# Patient Record
Sex: Male | Born: 1960 | Race: White | Hispanic: No | Marital: Single | Smoking: Former smoker
Health system: Southern US, Community
[De-identification: ages and names within clinical notes are randomized; demographics above are authoritative.]

## PROBLEM LIST (undated history)

## (undated) HISTORY — PX: TONSILLECTOMY: SUR1361

---

## 2008-09-10 ENCOUNTER — Ambulatory Visit: Payer: Self-pay | Admitting: Family Medicine

## 2009-01-22 ENCOUNTER — Ambulatory Visit: Payer: Self-pay | Admitting: Family Medicine
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
## 2010-09-02 ENCOUNTER — Inpatient Hospital Stay (INDEPENDENT_AMBULATORY_CARE_PROVIDER_SITE_OTHER)
Admission: RE | Admit: 2010-09-02 | Discharge: 2010-09-02 | Disposition: A | Discharge status: Home / Self Care | Payer: BC Managed Care – PPO | Source: Ambulatory Visit | Attending: Emergency Medicine | Admitting: Emergency Medicine

## 2010-09-02 ENCOUNTER — Encounter: Payer: Self-pay | Admitting: Emergency Medicine

## 2010-09-02 DIAGNOSIS — T148 Other injury of unspecified body region: Secondary | ICD-10-CM

## 2010-09-02 DIAGNOSIS — W57XXXA Bitten or stung by nonvenomous insect and other nonvenomous arthropods, initial encounter: Secondary | ICD-10-CM

## 2010-10-03 DIAGNOSIS — R51 Headache: Secondary | ICD-10-CM

## 2010-10-05 ENCOUNTER — Ambulatory Visit
Admission: RE | Admit: 2010-10-05 | Discharge: 2010-10-05 | Disposition: A | Discharge status: Home / Self Care | Payer: BC Managed Care – PPO | Source: Ambulatory Visit | Attending: Family Medicine | Admitting: Family Medicine

## 2010-10-05 ENCOUNTER — Inpatient Hospital Stay (INDEPENDENT_AMBULATORY_CARE_PROVIDER_SITE_OTHER)
Admission: RE | Admit: 2010-10-05 | Discharge: 2010-10-05 | Disposition: A | Discharge status: Home / Self Care | Payer: BC Managed Care – PPO | Source: Ambulatory Visit | Attending: Family Medicine | Admitting: Family Medicine

## 2010-10-05 ENCOUNTER — Other Ambulatory Visit: Payer: Self-pay | Admitting: Family Medicine

## 2010-10-05 ENCOUNTER — Encounter: Payer: Self-pay | Admitting: Family Medicine

## 2010-10-05 DIAGNOSIS — R51 Headache: Secondary | ICD-10-CM
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
## 2010-12-19 NOTE — Progress Notes (Signed)
Summary: ?bug bite/TM   Vital Signs:  Patient Profile:   50 Years Old Male CC:      bug bite on LT leg x 6 days  Height:     70 inches Weight:      272.75 pounds O2 Sat:      96 % O2 treatment:    Room Air Temp:     98.6 degrees F oral Pulse rate:   97 / minute Resp:     18 per minute BP sitting:   123 / 85  (left arm) Cuff size:   large  Vitals Entered By: Clemens Catholic LPN (September 02, 2010 4:52 PM)                  Updated Prior Medication List: No Medications Current Allergies (reviewed today): No known allergies History of Present Illness History from: patient Chief Complaint: bug bite on LT leg x 6 days  History of Present Illness: He thinks he was stung or bit by something about a week ago.  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  Wasp, spider? Noticed increasing redness, irritation.  Pain has resolved but still with some tingling.  Not using any meds.  No F/C/N/V or other symptoms.  REVIEW OF SYSTEMS Constitutional Symptoms      Denies fever, chills, night sweats, weight loss, weight gain, and fatigue.  Eyes       Denies change in vision, eye pain, eye discharge, glasses, contact lenses, and eye surgery. Ear/Nose/Throat/Mouth       Denies hearing loss/aids, change in hearing, ear pain, ear discharge, dizziness, frequent runny nose, frequent nose bleeds, sinus problems, sore throat, hoarseness, and tooth pain or bleeding.  Respiratory       Denies dry cough, productive cough, wheezing, shortness of breath, asthma, bronchitis, and emphysema/COPD.  Cardiovascular       Denies murmurs, chest pain, and tires easily with exhertion.    Gastrointestinal       Denies stomach pain, nausea/vomiting, diarrhea, constipation, blood in bowel movements, and indigestion. Genitourniary       Denies painful urination, kidney stones, and loss of urinary control. Neurological       Denies paralysis, seizures, and fainting/blackouts. Musculoskeletal       Complains of redness and  swelling.      Denies muscle pain, joint pain, joint stiffness, decreased range of motion, muscle weakness, and gout.  Skin       Denies bruising, unusual mles/lumps or sores, and hair/skin or nail changes.  Psych       Denies mood changes, temper/anger issues, anxiety/stress, speech problems, depression, and sleep problems. Other Comments: pt c/o bug bite on LT leg x 6 days. no OTC meds, no fever.   Past History:  Past Medical History: Reviewed history from 09/10/2008 and no changes required. Unremarkable  Past Surgical History: Kidney Stones removal surgery Lasik Tonsillectomy knee surgery  Family History: Reviewed history from 09/10/2008 and no changes required. Kidney Stones  Social History: Reviewed history from 09/10/2008 and no changes required. Married Never Smoked Alcohol use-yes, 2 drinks weekly Drug use-no Regular exercise-yes Physical Exam General appearance: well developed, well nourished, no acute distress MSE: oriented to time, place, and person L anterior shin with 2 bite marks with slight swelling and surrounding small ecchymoses approx 2cm in diameter.  I don't see any evidence of cellulitis or bacterial infection.  Normal gait.  Distal pulses normal.   Assessment New Problems: INSECT BITE (ICD-919.4)   Plan New  Medications/Changes: DOXYCYCLINE HYCLATE 100 MG CAPS (DOXYCYCLINE HYCLATE) 1 by mouth two times a day for 7 days  #14 x 0, 09/02/2010, Hoyt Koch MD CLOBETASOL PROPIONATE 0.05 % CREA (CLOBETASOL PROPIONATE) apply two times a day to affected area  #QS x20mo x 0, 09/02/2010, Hoyt Koch MD  New Orders: Est. Patient Level III [id number] Planning Comments:   This appears to be the after-effect of an insect bite, likely more ecchymotic from the protein/venom and not an infection. I gave Rx for steroid cream to reduce the irritation of the area.  If worsening symptoms, increasing redness, swelling, then can take the Doxy. However at this  time, I don't believe ABX are required.  Follow-up with your primary care physician if not improving or if getting worse   The patient and/or caregiver has been counseled thoroughly with regard to medications prescribed including dosage, schedule, interactions, rationale for use, and possible side effects and they verbalize understanding.  Diagnoses and expected course of recovery discussed and will return if not improved as expected or if the condition worsens. Patient and/or caregiver verbalized understanding.  Prescriptions: DOXYCYCLINE HYCLATE 100 MG CAPS (DOXYCYCLINE HYCLATE) 1 by mouth two times a day for 7 days  #14 x 0   Entered and Authorized by:   Hoyt Koch MD   Signed by:   Hoyt Koch MD on 09/02/2010   Method used:   Print then Give to Patient   RxID:   [id number] CLOBETASOL PROPIONATE 0.05 % CREA (CLOBETASOL PROPIONATE) apply two times a day to affected area  #QS x39mo x 0   Entered and Authorized by:   Hoyt Koch MD   Signed by:   Hoyt Koch MD on 09/02/2010   Method used:   Print then Give to Patient   RxID:   [id number]   Orders Added: 1)  Est. Patient Level III [29528]

## 2014-03-26 ENCOUNTER — Ambulatory Visit (INDEPENDENT_AMBULATORY_CARE_PROVIDER_SITE_OTHER): Payer: Managed Care, Other (non HMO) | Admitting: Family Medicine

## 2014-03-26 ENCOUNTER — Encounter: Payer: Self-pay | Admitting: Family Medicine

## 2014-03-26 VITALS — BP 150/96 | HR 62 | Ht 71.0 in | Wt 274.0 lb

## 2014-03-26 DIAGNOSIS — L570 Actinic keratosis: Secondary | ICD-10-CM

## 2014-03-26 DIAGNOSIS — Z23 Encounter for immunization: Secondary | ICD-10-CM

## 2014-03-26 DIAGNOSIS — I10 Essential (primary) hypertension: Secondary | ICD-10-CM

## 2014-03-26 DIAGNOSIS — Z0000 Encounter for general adult medical examination without abnormal findings: Secondary | ICD-10-CM

## 2014-03-26 NOTE — Addendum Note (Signed)
Addended by: Terance Hart on: 03/26/2014 10:55 AM   Modules accepted: Orders

## 2014-03-26 NOTE — Progress Notes (Signed)
CC: Alfred Dyer is a 54 y.o. male is here for Establish Care   Subjective: HPI:  Colonoscopy: Has never had a colonoscopy, referral has been placed and emphasized importance of this procedure Prostate: Discussed screening risks/beneifts with patient today, he doesn't think he's ever had a PSA he is open to the idea of getting this today  Influenza Vaccine: Declined Pneumovax: No current indication Td/Tdap: will receive today Zoster: (Start 53 yo)  Prior heavy alcohol use however stopped drinking 2 months ago. No tobacco or recreational drug use  Review of Systems - General ROS: negative for - chills, fever, night sweats, weight gain or weight loss Ophthalmic ROS: negative for - decreased vision Psychological ROS: negative for - anxiety or depression ENT ROS: negative for - hearing change, nasal congestion, tinnitus or allergies Hematological and Lymphatic ROS: negative for - bleeding problems, bruising or swollen lymph nodes Breast ROS: negative Respiratory ROS: no cough, shortness of breath, or wheezing Cardiovascular ROS: no chest pain or dyspnea on exertion Gastrointestinal ROS: no abdominal pain, change in bowel habits, or black or bloody stools Genito-Urinary ROS: negative for - genital discharge, genital ulcers, incontinence or abnormal bleeding from genitals Musculoskeletal ROS: negative for - joint pain or muscle pain Neurological ROS: negative for - headaches or memory loss Dermatological ROS: negative for lumps, mole changes, rash and skin lesion changes  No past medical history on file.  No past surgical history on file. No family history on file.  History   Social History  . Marital Status: Single    Spouse Name: N/A  . Number of Children: N/A  . Years of Education: N/A   Occupational History  . Not on file.   Social History Main Topics  . Smoking status: Never Smoker   . Smokeless tobacco: Not on file  . Alcohol Use: Not on file  . Drug Use: Not on file   . Sexual Activity: Not on file   Other Topics Concern  . Not on file   Social History Narrative  . No narrative on file     Objective: BP 150/96 mmHg  Pulse 62  Ht 5\' 11"  (1.803 m)  Wt 274 lb (124.286 kg)  BMI 38.23 kg/m2  General: No Acute Distress HEENT: Atraumatic, normocephalic, conjunctivae normal without scleral icterus.  No nasal discharge, hearing grossly intact, TMs with good landmarks bilaterally with no middle ear abnormalities, posterior pharynx clear without oral lesions. Neck: Supple, trachea midline, no cervical nor supraclavicular adenopathy. Pulmonary: Clear to auscultation bilaterally without wheezing, rhonchi, nor rales. Cardiac: Regular rate and rhythm.  No murmurs, rubs, nor gallops. No peripheral edema.  2+ peripheral pulses bilaterally. Abdomen: Bowel sounds normal.  No masses.  Non-tender without rebound.  Negative Murphy's sign. MSK: Grossly intact, no signs of weakness.  Full strength throughout upper and lower extremities.  Full ROM in upper and lower extremities.  No midline spinal tenderness. Neuro: Gait unremarkable, CN II-XII grossly intact.  C5-C6 Reflex 2/4 Bilaterally, L4 Reflex 2/4 Bilaterally.  Cerebellar function intact. Skin: No rashes other than actinic keratosis on the left temple Psych: Alert and oriented to person/place/time.  Thought process normal. No anxiety/depression. Assessment & Plan: Alfred Dyer was seen today for establish care.  Diagnoses and all orders for this visit:  Annual physical exam Orders: -     Ambulatory referral to Gastroenterology -     Lipid panel -     CBC -     COMPLETE METABOLIC PANEL WITH GFR -  PSA  Essential hypertension  Actinic keratosis   Healthy lifestyle interventions including but not limited to regular exercise, a healthy low fat diet, moderation of salt intake, the dangers of tobacco/alcohol/recreational drug use, nutrition supplementation, and accident avoidance were discussed with the patient  and a handout was provided for future reference. Essential hypertension: His blood pressures outside of our office is reportedly consistently below 140/90. I've asked him to bring in a blood pressure diary when he follow-ups in 3 months with documented blood pressures from the nurse at his office. For now will consider this whitecoat hypertension Actinic keratosis: We do not have any liquid nitrogen today however I've offered that we can remove this with cryotherapy at any future visit if he desires.  Return in about 3 months (around 06/26/2014) for Blood Pressure Follow Up and Freeze Skin On Forehead.

## 2014-03-26 NOTE — Patient Instructions (Signed)
Dr. Orvell Careaga's General Advice Following Your Complete Physical Exam  The Benefits of Regular Exercise: Unless you suffer from an uncontrolled cardiovascular condition, studies strongly suggest that regular exercise and physical activity will add to both the quality and length of your life.  The World Health Organization recommends 150 minutes of moderate intensity aerobic activity every week.  This is best split over 3-4 days a week, and can be as simple as a brisk walk for just over 35 minutes "most days of the week".  This type of exercise has been shown to lower LDL-Cholesterol, lower average blood sugars, lower blood pressure, lower cardiovascular disease risk, improve memory, and increase one's overall sense of wellbeing.  The addition of anaerobic (or "strength training") exercises offers additional benefits including but not limited to increased metabolism, prevention of osteoporosis, and improved overall cholesterol levels.  How Can I Strive For A Low-Fat Diet?: Current guidelines recommend that 25-35 percent of your daily energy (food) intake should come from fats.  One might ask how can this be achieved without having to dissect each meal on a daily basis?  Switch to skim or 1% milk instead of whole milk.  Focus on lean meats such as ground turkey, fresh fish, baked chicken, and lean cuts of beef as your source of dietary protein.  Limit saturated fat consumption to less than 10% of your daily caloric intake.  Limit trans fatty acid consumption primarily by limiting synthetic trans fats such as partially hydrogenated oils (Ex: fried fast foods).  Substitute olive or vegetable oil for solid fats where possible.  Moderation of Salt Intake: Provided you don't carry a diagnosis of congestive heart failure nor renal failure, I recommend a daily allowance of no more than 2300 mg of salt (sodium).  Keeping under this daily goal is associated with a decreased risk of cardiovascular events, creeping  above it can lead to elevated blood pressures and increases your risk of cardiovascular events.  Milligrams (mg) of salt is listed on all nutrition labels, and your daily intake can add up faster than you think.  Most canned and frozen dinners can pack in over half your daily salt allowance in one meal.    Lifestyle Health Risks: Certain lifestyle choices carry specific health risks.  As you may already know, tobacco use has been associated with increasing one's risk of cardiovascular disease, pulmonary disease, numerous cancers, among many other issues.  What you may not know is that there are medications and nicotine replacement strategies that can more than double your chances of successfully quitting.  I would be thrilled to help manage your quitting strategy if you currently use tobacco products.  When it comes to alcohol use, I've yet to find an "ideal" daily allowance.  Provided an individual does not have a medical condition that is exacerbated by alcohol consumption, general guidelines determine "safe drinking" as no more than two standard drinks for a man or no more than one standard drink for a male per day.  However, much debate still exists on whether any amount of alcohol consumption is technically "safe".  My general advice, keep alcohol consumption to a minimum for general health promotion.  If you or others believe that alcohol, tobacco, or recreational drug use is interfering with your life, I would be happy to provide confidential counseling regarding treatment options.  General "Over The Counter" Nutrition Advice: Postmenopausal women should aim for a daily calcium intake of 1200 mg, however a significant portion of this might already be   provided by diets including milk, yogurt, cheese, and other dairy products.  Vitamin D has been shown to help preserve bone density, prevent fatigue, and has even been shown to help reduce falls in the elderly.  Ensuring a daily intake of 800 Units of  Vitamin D is a good place to start to enjoy the above benefits, we can easily check your Vitamin D level to see if you'd potentially benefit from supplementation beyond 800 Units a day.  Folic Acid intake should be of particular concern to women of childbearing age.  Daily consumption of 400-800 mcg of Folic Acid is recommended to minimize the chance of spinal cord defects in a fetus should pregnancy occur.    For many adults, accidents still remain one of the most common culprits when it comes to cause of death.  Some of the simplest but most effective preventitive habits you can adopt include regular seatbelt use, proper helmet use, securing firearms, and regularly testing your smoke and carbon monoxide detectors.  Alfred Bene B. Aleesha Ringstad DO [facility] [address], [address] [place], [place] [address] Phone: [phone]  

## 2014-03-27 ENCOUNTER — Telehealth: Payer: Self-pay | Admitting: Family Medicine

## 2014-03-27 ENCOUNTER — Encounter: Payer: Self-pay | Admitting: Family Medicine

## 2014-03-27 DIAGNOSIS — D696 Thrombocytopenia, unspecified: Secondary | ICD-10-CM

## 2014-03-27 DIAGNOSIS — D72819 Decreased white blood cell count, unspecified: Secondary | ICD-10-CM

## 2014-03-27 LAB — COMPLETE METABOLIC PANEL WITH GFR
ALK PHOS: 55 U/L (ref 39–117)
ALT: 22 U/L (ref 0–53)
AST: 27 U/L (ref 0–37)
Albumin: 4.2 g/dL (ref 3.5–5.2)
BUN: 19 mg/dL (ref 6–23)
CO2: 24 mEq/L (ref 19–32)
Calcium: 8.7 mg/dL (ref 8.4–10.5)
Chloride: 102 mEq/L (ref 96–112)
Creat: 0.9 mg/dL (ref 0.50–1.35)
GFR, Est African American: >89 mL/min
Glucose, Bld: 73 mg/dL (ref 70–99)
POTASSIUM: 4.8 meq/L (ref 3.5–5.3)
SODIUM: 142 meq/L (ref 135–145)
Total Bilirubin: 0.8 mg/dL (ref 0.2–1.2)
Total Protein: 6.8 g/dL (ref 6.0–8.3)

## 2014-03-27 LAB — LIPID PANEL
CHOLESTEROL: 154 mg/dL (ref 0–200)
HDL: 41 mg/dL (ref >=40)
LDL CALC: 92 mg/dL (ref 0–99)
TRIGLYCERIDES: 103 mg/dL (ref <150)
Total CHOL/HDL Ratio: 3.8 Ratio
VLDL: 21 mg/dL (ref 0–40)

## 2014-03-27 LAB — PSA: PSA: 1.23 ng/mL (ref <=4.00)

## 2014-03-27 LAB — CBC
HEMATOCRIT: 41.6 % (ref 39.0–52.0)
HEMOGLOBIN: 14 g/dL (ref 13.0–17.0)
MCH: 29.2 pg (ref 26.0–34.0)
MCHC: 33.7 g/dL (ref 30.0–36.0)
MCV: 86.8 fL (ref 78.0–100.0)
Platelets: 87 10*3/uL — ABNORMAL LOW (ref 150–400)
RBC: 4.79 MIL/uL (ref 4.22–5.81)
RDW: 16.1 % — ABNORMAL HIGH (ref 11.5–15.5)
WBC: 3.4 10*3/uL — ABNORMAL LOW (ref 4.0–10.5)

## 2014-03-27 NOTE — Telephone Encounter (Signed)
Seth Bake, Will you please let patient know that his blood sugar, kidney function, liver function, and cholesterol were all normal.  His WBC and platelet count were below the lower limit of normal.  I'd recommend he return for a lab only visit in one week to have these blood cell counts rechecked.  I'll put a lab slip in your inbox.

## 2014-03-27 NOTE — Telephone Encounter (Signed)
Left message on voicemail.

## 2014-03-31 ENCOUNTER — Telehealth: Payer: Self-pay | Admitting: Family Medicine

## 2014-03-31 DIAGNOSIS — D72819 Decreased white blood cell count, unspecified: Secondary | ICD-10-CM

## 2014-03-31 DIAGNOSIS — D696 Thrombocytopenia, unspecified: Secondary | ICD-10-CM

## 2014-03-31 LAB — CBC WITH DIFFERENTIAL/PLATELET
Basophils Absolute: 0 10*3/uL (ref 0.0–0.1)
Basophils Relative: 0 % (ref 0–1)
EOS PCT: 3 % (ref 0–5)
Eosinophils Absolute: 0.1 10*3/uL (ref 0.0–0.7)
HEMATOCRIT: 39.4 % (ref 39.0–52.0)
Hemoglobin: 13.4 g/dL (ref 13.0–17.0)
LYMPHS ABS: 0.9 10*3/uL (ref 0.7–4.0)
LYMPHS PCT: 33 % (ref 12–46)
MCH: 29.8 pg (ref 26.0–34.0)
MCHC: 34 g/dL (ref 30.0–36.0)
MCV: 87.8 fL (ref 78.0–100.0)
MONO ABS: 0.3 10*3/uL (ref 0.1–1.0)
MPV: 11.4 fL (ref 8.6–12.4)
Monocytes Relative: 11 % (ref 3–12)
Neutro Abs: 1.5 10*3/uL — ABNORMAL LOW (ref 1.7–7.7)
Neutrophils Relative %: 53 % (ref 43–77)
Platelets: 85 10*3/uL — ABNORMAL LOW (ref 150–400)
RBC: 4.49 MIL/uL (ref 4.22–5.81)
RDW: 16.4 % — ABNORMAL HIGH (ref 11.5–15.5)
WBC: 2.8 10*3/uL — AB (ref 4.0–10.5)

## 2014-03-31 NOTE — Telephone Encounter (Signed)
Alfred Dyer, Will you please let patient know that his WBC and platelet count remain below the lower limit of normal.  Although I don't think it's an emergency, I'm going to refer him to a hematologist so that they can discuss whether or not any further workup is needed.  Please let me know if not contacted by next week.

## 2014-03-31 NOTE — Telephone Encounter (Signed)
Pt.notified

## 2014-04-07 ENCOUNTER — Telehealth: Payer: Self-pay | Admitting: Hematology & Oncology

## 2014-04-07 NOTE — Telephone Encounter (Signed)
Left vm w NEW PATIENT today to remind them of their appointment with Dr. Ennever. Also, advised them to bring all medication bottles and insurance card information. ° °

## 2014-04-08 ENCOUNTER — Other Ambulatory Visit (HOSPITAL_BASED_OUTPATIENT_CLINIC_OR_DEPARTMENT_OTHER): Payer: Managed Care, Other (non HMO) | Admitting: Lab

## 2014-04-08 ENCOUNTER — Encounter: Payer: Self-pay | Admitting: Family

## 2014-04-08 ENCOUNTER — Ambulatory Visit: Payer: Managed Care, Other (non HMO)

## 2014-04-08 ENCOUNTER — Ambulatory Visit (HOSPITAL_BASED_OUTPATIENT_CLINIC_OR_DEPARTMENT_OTHER): Payer: Managed Care, Other (non HMO) | Admitting: Family

## 2014-04-08 ENCOUNTER — Other Ambulatory Visit: Payer: Self-pay | Admitting: Family

## 2014-04-08 DIAGNOSIS — D72819 Decreased white blood cell count, unspecified: Secondary | ICD-10-CM

## 2014-04-08 DIAGNOSIS — D696 Thrombocytopenia, unspecified: Secondary | ICD-10-CM

## 2014-04-08 LAB — CBC WITH DIFFERENTIAL (CANCER CENTER ONLY)
BASO#: 0 10*3/uL (ref 0.0–0.2)
BASO%: 0.5 % (ref 0.0–2.0)
EOS%: 3.8 % (ref 0.0–7.0)
Eosinophils Absolute: 0.2 10*3/uL (ref 0.0–0.5)
HCT: 40.9 % (ref 38.7–49.9)
HEMOGLOBIN: 14.4 g/dL (ref 13.0–17.1)
LYMPH#: 1 10*3/uL (ref 0.9–3.3)
LYMPH%: 26.6 % (ref 14.0–48.0)
MCH: 30.4 pg (ref 28.0–33.4)
MCHC: 35.2 g/dL (ref 32.0–35.9)
MCV: 86 fL (ref 82–98)
MONO#: 0.4 10*3/uL (ref 0.1–0.9)
MONO%: 11 % (ref 0.0–13.0)
NEUT#: 2.3 10*3/uL (ref 1.5–6.5)
NEUT%: 58.1 % (ref 40.0–80.0)
RBC: 4.74 10*6/uL (ref 4.20–5.70)
RDW: 14.5 % (ref 11.1–15.7)
WBC: 3.9 10*3/uL — ABNORMAL LOW (ref 4.0–10.0)

## 2014-04-08 LAB — CHCC SATELLITE - SMEAR

## 2014-04-08 NOTE — Progress Notes (Signed)
Hematology/Oncology Consultation   Name: Alfred Dyer      MRN: [id number]    Location: Room/bed info not found  Date: 04/08/2014 Time:2:15 PM   REFERRING PHYSICIAN: Sean Hommel  REASON FOR CONSULT:  Thrombocytopenia Leukopenia   DIAGNOSIS:  Thrombocytopenia Leukopenia  HISTORY OF PRESENT ILLNESS:  Alfred Dyer is a very pleasant 54 yo white male with a recent history of thrombocytopenia and leukopenia. His WBC count today is 3.9 and platelets are 87. He has had no problems with infections or episodes of bleeding.  He has been feeling tired and a little "light headed" at times.  He has a history of alcohol abuse. He was drinking 3-4 1/2 gallons of whisky/week. He has not had a drink in 2 months and 6 days. He is very happy about this.  He states that his LFTs usually run "slightly elevated." He states that he is "Rh - and had a complete blood transfusions as a small child."  He denies any personal or familial history of cancer.   No history of blood clots or bleeding disorders.  He has 3 children all of whom are healthy.  He is on a great deal of vitamin and weight loss supplements.  He spent 17 years in the [employer] and [employer].  He denies fever, chills, n/v, cough, rash, dizziness, blurred vision, dizziness, SOB, chest pain, palpitations, abdominal pain, constipation, diarrhea, blood in urine or stool.  No swelling, tenderness, numbness or tingling in his extremities.  His appetite is good and he is staying hydrated. His weight is stable.   ROS: All other 10 point review of systems is negative.   PAST MEDICAL HISTORY:   History reviewed. No pertinent past medical history.  ALLERGIES: No Known Allergies    MEDICATIONS:  No current outpatient prescriptions on file prior to visit.   No current facility-administered medications on file prior to visit.     PAST SURGICAL HISTORY Past Surgical History  Procedure Laterality Date  . Tonsillectomy      FAMILY  HISTORY: History reviewed. No pertinent family history.  SOCIAL HISTORY:  reports that he quit smoking about 41 years ago. He does not have any smokeless tobacco history on file. He reports that he does not use illicit drugs. His alcohol history is not on file.  PERFORMANCE STATUS: The patient's performance status is 0 - Asymptomatic  PHYSICAL EXAM: Most Recent Vital Signs: Blood pressure 137/97, pulse 88, temperature 98 F (36.7 C), temperature source Oral, resp. rate 16, height 5\' 11"  (1.803 m), weight 274 lb (124.286 kg). BP 137/97 mmHg  Pulse 88  Temp(Src) 98 F (36.7 C) (Oral)  Resp 16  Ht 5\' 11"  (1.803 m)  Wt 274 lb (124.286 kg)  BMI 38.23 kg/m2  General Appearance:    Alert, cooperative, no distress, appears stated age  Head:    Normocephalic, without obvious abnormality, atraumatic  Eyes:    PERRL, conjunctiva/corneas clear, EOM's intact, fundi    benign, both eyes             Throat:   Lips, mucosa, and tongue normal; teeth and gums normal  Neck:   Supple, symmetrical, trachea midline, no adenopathy;       thyroid:  No enlargement/tenderness/nodules; no carotid   bruit or JVD  Back:     Symmetric, no curvature, ROM normal, no CVA tenderness  Lungs:     Clear to auscultation bilaterally, respirations unlabored  Chest wall:    No tenderness or deformity  Heart:    Regular rate and rhythm, S1 and S2 normal, no murmur, rub   or gallop  Abdomen:     Soft, non-tender, bowel sounds active all four quadrants,    no masses, no organomegaly        Extremities:   Extremities normal, atraumatic, no cyanosis or edema  Pulses:   2+ and symmetric all extremities  Skin:   Skin color, texture, turgor normal, no rashes or lesions  Lymph nodes:   Cervical, supraclavicular, and axillary nodes normal  Neurologic:   CNII-XII intact. Normal strength, sensation and reflexes      throughout   LABORATORY DATA:  No results found for this or any previous visit (from the past 48 hour(s)).     RADIOGRAPHY: No results found.     PATHOLOGY: None  ASSESSMENT/PLAN: Alfred Dyer is a very pleasant 54 yo white male with a recent history of thrombocytopenia and leukopenia.  His WBC count today is 3.9 and platelets are 87. He has had no problems with infections or episodes of bleeding. We will see what the rest of his blood work shows.   He has a significant history of ETOH abuse. We will get an ultrasound of the abdomen to assess his liver.  Dr. Marin Olp did view his blood smear and noticed no abnormality or evidence of malignancy. We will see him back in 4 months for labs and follow-up.  All questions were answered. The patient knows to call the clinic with any problems, questions or concerns. We can certainly see the patient much sooner if necessary.  The patient was discussed with and also seen by Dr. Marin Olp and he is in agreement with the aforementioned.   [facility]    Addendum:   I saw and examined the patient with Sarah. He is a real nice man. I fun talking to him.  His blood smear did not show anything that looked suspicious for a hematologic issue. They good maturation of his white blood cells. I do not see any hypersegmented polys. He had no atypical lymphocytes. There were no immature myeloid cells. His platelets were slightly decreased in number. He has several large platelets. Platelets were well granulated.  I think that the issue with him has been his alcohol use. He has a history of heavy alcohol use. He's not had anything to drink for 2 months. 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 If this is the case, then it is possible that his platelets and white cells will improve with continued alcohol abstinence.  I will get a ultrasound of his abdomen just to make sure nothing else is going on. He is a big man so it is hard to palpate his spleen. I suppose that he may have some cirrhosis with splenomegaly. If this is the case, then this  would certainly explain his hematologic changes.  I think we can probably see him back in about 3 months or so. We will try to see how things look with the ultrasound area and  I don't see that he needs a bone marrow test.  We spent about 45 minutes with him. We answered all his questions.  Lum Keas

## 2014-04-09 ENCOUNTER — Telehealth: Payer: Self-pay | Admitting: Hematology & Oncology

## 2014-04-09 LAB — RHEUMATOID FACTOR: RHEUMATOID FACTOR: 61 [IU]/mL — AB (ref <=14)

## 2014-04-09 LAB — ANA: ANA: NEGATIVE

## 2014-04-09 NOTE — Telephone Encounter (Signed)
Pt aware of 4-20 [place] to be NPO 6 hrs and 7-20 appointment

## 2014-04-24 LAB — HM COLONOSCOPY

## 2014-04-28 ENCOUNTER — Encounter: Payer: Self-pay | Admitting: Family Medicine

## 2014-04-30 ENCOUNTER — Encounter: Payer: Self-pay | Admitting: Family Medicine

## 2014-04-30 DIAGNOSIS — Z86010 Personal history of colonic polyps: Secondary | ICD-10-CM | POA: Insufficient documentation

## 2014-05-06 ENCOUNTER — Ambulatory Visit (HOSPITAL_BASED_OUTPATIENT_CLINIC_OR_DEPARTMENT_OTHER)
Admission: RE | Admit: 2014-05-06 | Discharge: 2014-05-06 | Disposition: A | Discharge status: Home / Self Care | Payer: Managed Care, Other (non HMO) | Source: Ambulatory Visit | Attending: Family | Admitting: Family

## 2014-05-06 DIAGNOSIS — R161 Splenomegaly, not elsewhere classified: Secondary | ICD-10-CM | POA: Diagnosis not present

## 2014-05-06 DIAGNOSIS — D696 Thrombocytopenia, unspecified: Secondary | ICD-10-CM | POA: Insufficient documentation

## 2014-05-13 ENCOUNTER — Ambulatory Visit (INDEPENDENT_AMBULATORY_CARE_PROVIDER_SITE_OTHER): Payer: Managed Care, Other (non HMO)

## 2014-05-13 ENCOUNTER — Encounter: Payer: Self-pay | Admitting: Family Medicine

## 2014-05-13 ENCOUNTER — Ambulatory Visit (INDEPENDENT_AMBULATORY_CARE_PROVIDER_SITE_OTHER): Payer: Managed Care, Other (non HMO) | Admitting: Family Medicine

## 2014-05-13 VITALS — BP 130/82 | HR 69 | Wt 274.0 lb

## 2014-05-13 DIAGNOSIS — K7460 Unspecified cirrhosis of liver: Secondary | ICD-10-CM | POA: Insufficient documentation

## 2014-05-13 DIAGNOSIS — M542 Cervicalgia: Secondary | ICD-10-CM | POA: Diagnosis not present

## 2014-05-13 DIAGNOSIS — K7030 Alcoholic cirrhosis of liver without ascites: Secondary | ICD-10-CM

## 2014-05-13 NOTE — Progress Notes (Signed)
CC: Alfred Dyer is a 54 y.o. male is here for discuss u/s results and Neck Pain   Subjective: HPI:  Recently had an ultrasound of liver done reflecting cirrhosis. He tells me he's never had any right upper quadrant pain nor jaundice. He admits that he was a heavy drinker for the majority of his life but has been sober for 4 months now. He does not that he was ever exposed to hepatitis B or C but he cannot be entirely sure. Denies any recent confusion or abdominal pain in general.  Complains today also of having posterior neck pain that's been present for about a year and a half now. It's nonradiating described as a stiffness and it fluctuates between mild and moderate in severity. No benefit from over-the-counter anti-inflammatories. Improves greatly with chiropractic maneuvers however only lasts for about 1 day. Denies any recent motor or sensory disturbances nor radiation of pain into the shoulders or arms. No particular position of the head or neck worsens or alleviates the pain. Denies difficulty swallowing chest pain or shortness of breath   Review Of Systems Outlined In HPI  No past medical history on file.  Past Surgical History  Procedure Laterality Date  . Tonsillectomy     No family history on file.  History   Social History  . Marital Status: Single    Spouse Name: N/A  . Number of Children: N/A  . Years of Education: N/A   Occupational History  . Not on file.   Social History Main Topics  . Smoking status: Former Smoker    Quit date: 03/25/1973  . Smokeless tobacco: Not on file  . Alcohol Use: Not on file  . Drug Use: No  . Sexual Activity:    Partners: Female   Other Topics Concern  . Not on file   Social History Narrative     Objective: BP 130/82 mmHg  Pulse 69  Wt 274 lb (124.286 kg)  General: Alert and Oriented, No Acute Distress HEENT: Pupils equal, round, reactive to light. Conjunctivae clear.  Moist his membranes Lungs: Clear and comfortable  work of breathing Cardiac: Regular rate and rhythm. Abdomen: Obese and soft Neck: No midline spinous process tenderness in the cervical spine, full range of motion and strengthen the cervical spine in all 3 planes. No paraspinal musculature tenderness or hypertonicity. Extremities: No peripheral edema.  Strong peripheral pulses.  Mental Status: No depression, anxiety, nor agitation. Skin: Warm and dry.  Assessment & Plan: Alfred Dyer was seen today for discuss u/s results and neck pain.  Diagnoses and all orders for this visit:  Alcoholic cirrhosis of liver without ascites Orders: -     Hepatitis C antibody -     Hepatitis B Surface AntiBODY -     Alpha-1-antitrypsin -     Ambulatory referral to Gastroenterology  Neck pain Orders: -     DG Cervical Spine Complete; Future   Alcoholic cirrhosis: Currently is asymptomatic, congratulated his continued cessation of alcohol and emphasize the importance of complete cessation for the rest of his life. Liver function from his first visit with me earlier this year was normal therefore will not pursue that test again. He is agreeable to ruling out hepatitis C or B infection or even alpha antitrypsin deficiency that could have contributed to his cirrhosis. Referral to gastroenterology for consideration of biopsy. Neck pain: Obtaining plain films of the spine, ultimate plan will be based on the results of this.    Return if symptoms worsen  or fail to improve.

## 2014-05-14 ENCOUNTER — Telehealth: Payer: Self-pay | Admitting: Family Medicine

## 2014-05-14 DIAGNOSIS — R9389 Abnormal findings on diagnostic imaging of other specified body structures: Secondary | ICD-10-CM

## 2014-05-14 MED ORDER — MELOXICAM 15 MG PO TABS: 15.0000 mg | ORAL_TABLET | Freq: Every day | ORAL | Status: DC | PRN

## 2014-05-14 NOTE — Telephone Encounter (Signed)
Alfred Dyer, Will you please let patient know that his xrays did not show any significant spine disease which is reassuring.  This makes me conclude that his posterior neck pain is coming from the muscles.  I'd recommend he start a as needed anti-inflammatory called meloxicam that i sent to his [facility] [place].  This replaces any naproxen he would be taking.    Also the radiologist has recommend a chest xray since one of the images they took looked distorted and didn't get a clear picture of the lower neck.  Orders for this have been placed, all he needs to do is show up to the imaging office at his convenience.

## 2014-05-14 NOTE — Telephone Encounter (Signed)
Pt.notified

## 2014-05-15 ENCOUNTER — Ambulatory Visit (INDEPENDENT_AMBULATORY_CARE_PROVIDER_SITE_OTHER): Payer: Managed Care, Other (non HMO)

## 2014-05-15 DIAGNOSIS — R9389 Abnormal findings on diagnostic imaging of other specified body structures: Secondary | ICD-10-CM

## 2014-05-15 DIAGNOSIS — R938 Abnormal findings on diagnostic imaging of other specified body structures: Secondary | ICD-10-CM | POA: Diagnosis not present

## 2014-06-02 ENCOUNTER — Encounter: Payer: Self-pay | Admitting: Family Medicine

## 2014-06-02 DIAGNOSIS — I8500 Esophageal varices without bleeding: Secondary | ICD-10-CM | POA: Insufficient documentation

## 2014-06-22 ENCOUNTER — Other Ambulatory Visit: Payer: Self-pay | Admitting: Adult Health

## 2014-06-22 ENCOUNTER — Ambulatory Visit (INDEPENDENT_AMBULATORY_CARE_PROVIDER_SITE_OTHER): Payer: Self-pay

## 2014-06-22 DIAGNOSIS — M546 Pain in thoracic spine: Secondary | ICD-10-CM

## 2014-06-22 DIAGNOSIS — R52 Pain, unspecified: Secondary | ICD-10-CM

## 2014-06-22 DIAGNOSIS — M542 Cervicalgia: Secondary | ICD-10-CM

## 2014-06-29 ENCOUNTER — Ambulatory Visit: Payer: Managed Care, Other (non HMO) | Admitting: Family Medicine

## 2014-08-05 ENCOUNTER — Other Ambulatory Visit: Payer: Managed Care, Other (non HMO)

## 2014-08-05 ENCOUNTER — Ambulatory Visit: Payer: Managed Care, Other (non HMO) | Admitting: Hematology & Oncology

## 2015-03-02 ENCOUNTER — Other Ambulatory Visit: Payer: Self-pay | Admitting: Gastroenterology

## 2015-03-02 DIAGNOSIS — K7460 Unspecified cirrhosis of liver: Secondary | ICD-10-CM

## 2015-03-03 ENCOUNTER — Ambulatory Visit (INDEPENDENT_AMBULATORY_CARE_PROVIDER_SITE_OTHER): Payer: Managed Care, Other (non HMO)

## 2015-03-03 DIAGNOSIS — K7460 Unspecified cirrhosis of liver: Secondary | ICD-10-CM

## 2015-06-23 ENCOUNTER — Encounter: Payer: Self-pay | Admitting: Family Medicine

## 2015-06-23 ENCOUNTER — Ambulatory Visit (INDEPENDENT_AMBULATORY_CARE_PROVIDER_SITE_OTHER): Payer: Managed Care, Other (non HMO) | Admitting: Family Medicine

## 2015-06-23 VITALS — BP 144/98 | HR 101 | Wt 279.0 lb

## 2015-06-23 DIAGNOSIS — J329 Chronic sinusitis, unspecified: Secondary | ICD-10-CM

## 2015-06-23 DIAGNOSIS — K219 Gastro-esophageal reflux disease without esophagitis: Secondary | ICD-10-CM | POA: Diagnosis not present

## 2015-06-23 DIAGNOSIS — B9689 Other specified bacterial agents as the cause of diseases classified elsewhere: Secondary | ICD-10-CM

## 2015-06-23 DIAGNOSIS — D1720 Benign lipomatous neoplasm of skin and subcutaneous tissue of unspecified limb: Secondary | ICD-10-CM

## 2015-06-23 DIAGNOSIS — R252 Cramp and spasm: Secondary | ICD-10-CM

## 2015-06-23 DIAGNOSIS — A499 Bacterial infection, unspecified: Secondary | ICD-10-CM

## 2015-06-23 MED ORDER — OMEPRAZOLE 40 MG PO CPDR: DELAYED_RELEASE_CAPSULE | ORAL | Status: DC

## 2015-06-23 MED ORDER — AZITHROMYCIN 250 MG PO TABS: ORAL_TABLET | ORAL | Status: AC

## 2015-06-23 NOTE — Progress Notes (Signed)
CC: Alfred Dyer is a 55 y.o. male is here for Weight Loss and Generalized Body Aches   Subjective: HPI:  Follow GERD: He is taking over-the-counter omeprazole on a daily basis. He is also taking vimovo and once okay to take both of these medications at the same time. He denies any epigastric discomfort provided he takes omeprazole daily. He denies any gastrointestinal complaints today.  He has a mass on his right forearm that's been present for matter of years and slowly enlarging. It's tender to the touch and he wants know if I can remove it for him. He denies skin changes elsewhere.  His major complaint today is cramping in the lower extremities that occurs at night almost every night of the week. Over the past 2 or 3 weeks it's been worsening. It can involve his face, his thighs, calves, or feet. No interventions as of yet other than taking magnesium but it doesn't seem to be helping. He recently had blood work done that showed normal lateral lites other than a mildly depressed calcium. He denies any fasciculations or weakness. He denies any other motor or sensory disturbances.  He was recently diagnosed with a bacterial sinus infection. He's been taking Augmentin haphazardly for the last week or 2. He tells me that he has difficulty complying with something for more than 5 days straight. Continues to have nasal congestion and facial pressure with nonproductive cough   Review Of Systems Outlined In HPI  No past medical history on file.  Past Surgical History  Procedure Laterality Date  . Tonsillectomy     No family history on file.  Social History   Social History  . Marital Status: Single    Spouse Name: N/A  . Number of Children: N/A  . Years of Education: N/A   Occupational History  . Not on file.   Social History Main Topics  . Smoking status: Former Smoker    Quit date: 03/25/1973  . Smokeless tobacco: Not on file  . Alcohol Use: Not on file  . Drug Use: No  . Sexual  Activity:    Partners: Female   Other Topics Concern  . Not on file   Social History Narrative     Objective: BP 144/98 mmHg  Pulse 101  Wt 279 lb (126.554 kg)  General: Alert and Oriented, No Acute Distress HEENT: Pupils equal, round, reactive to light. Conjunctivae clear.  External ears unremarkable, canals clear with intact TMs with appropriate landmarks.  Middle ear appears open without effusion. Pink inferior turbinates.  Moist mucous membranes, pharynx without inflammation nor lesions.  Neck supple without palpable lymphadenopathy nor abnormal masses. Lungs: Clear to auscultation bilaterally, no wheezing/ronchi/rales.  Comfortable work of breathing. Good air movement. Cardiac: Regular rate and rhythm. Normal S1/S2.  No murmurs, rubs, nor gallops.   Abdomen: Moderate obesity Extremities: No peripheral edema.  Strong peripheral pulses.  Mental Status: No depression, anxiety, nor agitation. Skin: Warm and dry. Tender lipoma on the right arm  Assessment & Plan: Alfred Dyer was seen today for weight loss and generalized body aches.  Diagnoses and all orders for this visit:  Gastroesophageal reflux disease without esophagitis -     omeprazole (PRILOSEC) 40 MG capsule; One by mouth daily at least one hour before a meal.  Lipoma of arm -     Ambulatory referral to General Surgery  Cramps, extremity -     Magnesium -     Comprehensive Metabolic Panel (CMET)  Bacterial sinusitis  Other orders -  azithromycin (ZITHROMAX) 250 MG tablet; Take two tabs at once on day 1, then one tab daily on days 2-5.   GERD: Controlled it's okay to take vimovo along with omeprazole, it'll be cheaper if he takes a prescription as opposed to over-the-counter omeprazole Lower extremity cramping, rechecking calcium, electrolytes and check a magnesium for the first time. From what I can tell he drinks very little water and is a heavy sweater Bacterial sinusitis: He believes that he'll be more  compliant with azithromycin.   Return if symptoms worsen or fail to improve.

## 2015-06-24 LAB — COMPREHENSIVE METABOLIC PANEL
ALK PHOS: 52 U/L (ref 40–115)
ALT: 45 U/L (ref 9–46)
AST: 24 U/L (ref 10–35)
Albumin: 4.2 g/dL (ref 3.6–5.1)
BUN: 22 mg/dL (ref 7–25)
CO2: 25 mmol/L (ref 20–31)
Calcium: 9.6 mg/dL (ref 8.6–10.3)
Chloride: 104 mmol/L (ref 98–110)
Creat: 1.02 mg/dL (ref 0.70–1.33)
Glucose, Bld: 110 mg/dL — ABNORMAL HIGH (ref 65–99)
POTASSIUM: 4.1 mmol/L (ref 3.5–5.3)
Sodium: 140 mmol/L (ref 135–146)
Total Bilirubin: 1 mg/dL (ref 0.2–1.2)
Total Protein: 6.5 g/dL (ref 6.1–8.1)

## 2015-06-24 LAB — MAGNESIUM: Magnesium: 2 mg/dL (ref 1.5–2.5)

## 2015-06-29 ENCOUNTER — Other Ambulatory Visit: Payer: Self-pay | Admitting: Family Medicine

## 2015-06-29 DIAGNOSIS — R079 Chest pain, unspecified: Secondary | ICD-10-CM

## 2016-02-28 ENCOUNTER — Other Ambulatory Visit: Payer: Self-pay

## 2016-02-28 DIAGNOSIS — K219 Gastro-esophageal reflux disease without esophagitis: Secondary | ICD-10-CM

## 2016-02-28 MED ORDER — OMEPRAZOLE 40 MG PO CPDR: 40.0000 mg | DELAYED_RELEASE_CAPSULE | Freq: Every day | ORAL | 0 refills | Status: AC

## 2016-08-31 IMAGING — US US ABDOMEN COMPLETE
1 series · 13 of 25 positions shown · non-contrast
Comparison: Ultrasound of the abdomen of 05/06/2014

CLINICAL DATA: History of ETOH abuse and cirrhosis, history of
kidney stones

EXAM:
ABDOMEN ULTRASOUND COMPLETE

[Series 1: us abdomen complete · 0.29mm/px · 13 of 63 slices shown]
[im 1/63]
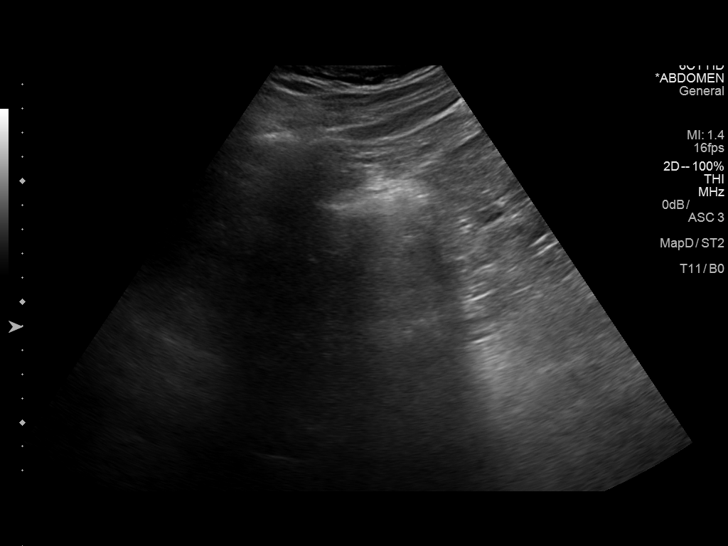
[im 6/63]
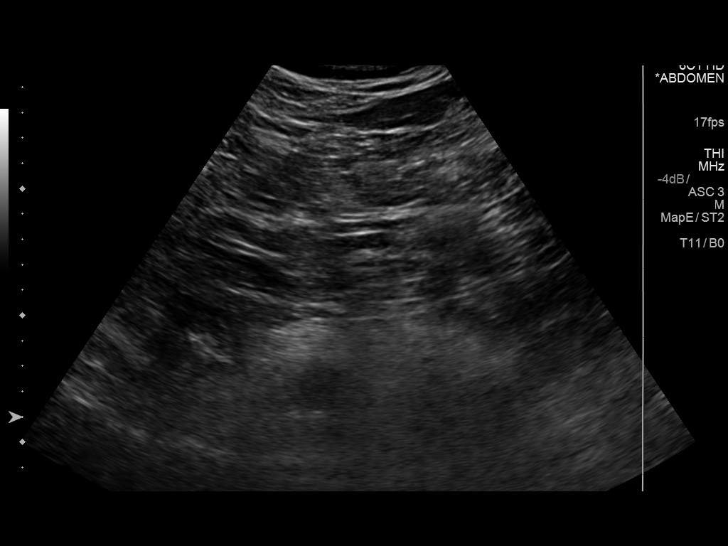
[im 11/63]
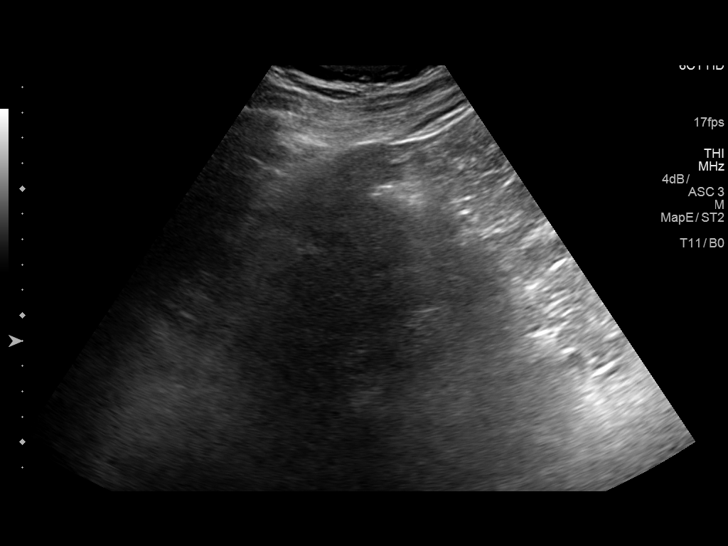
[im 16/63]
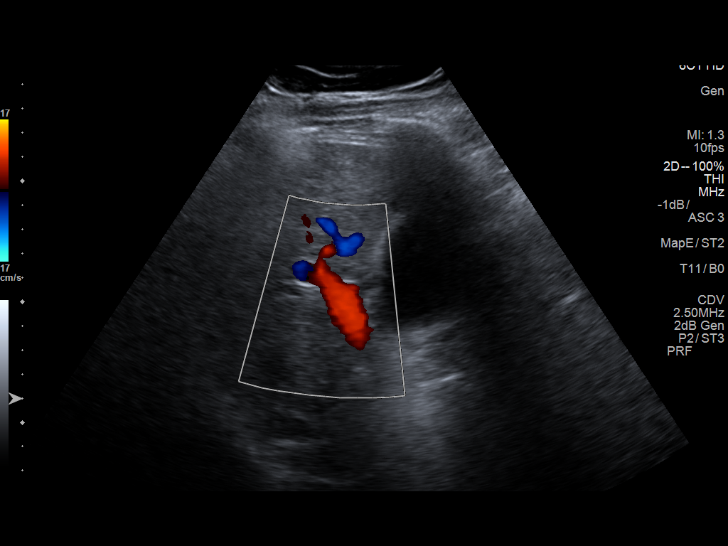
[im 21/63]
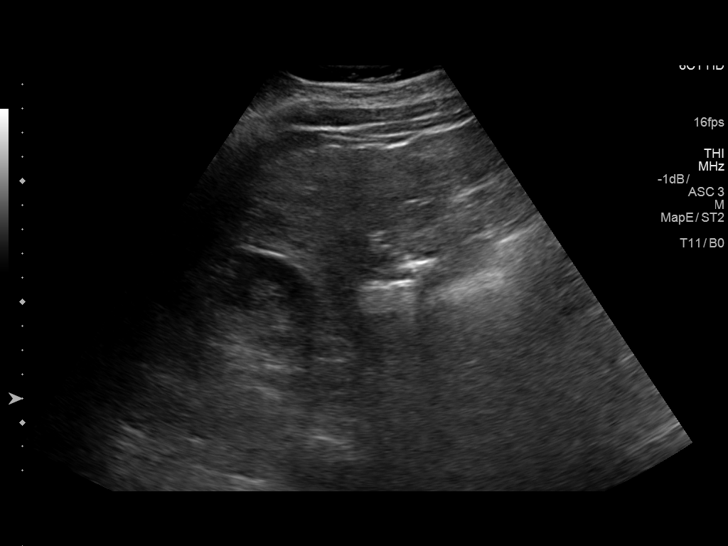
[im 26/63]
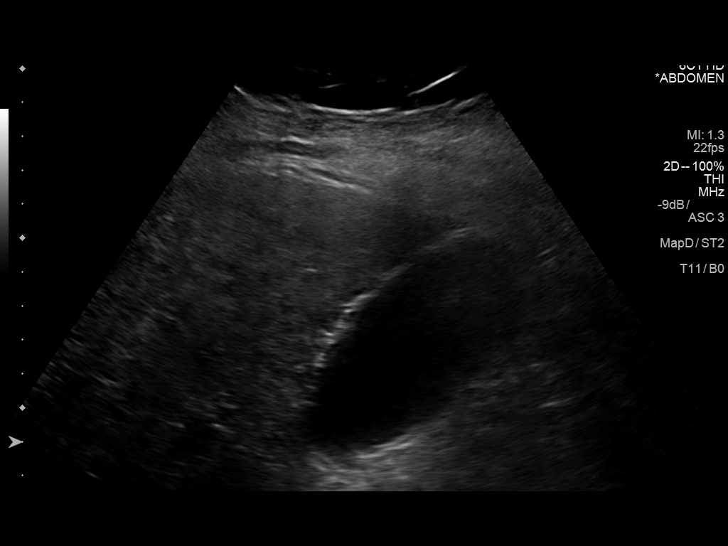
[im 32/63]
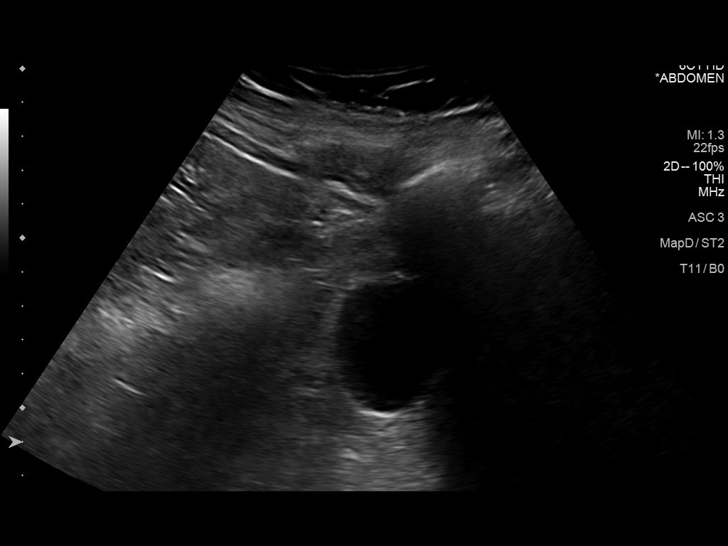
[im 37/63]
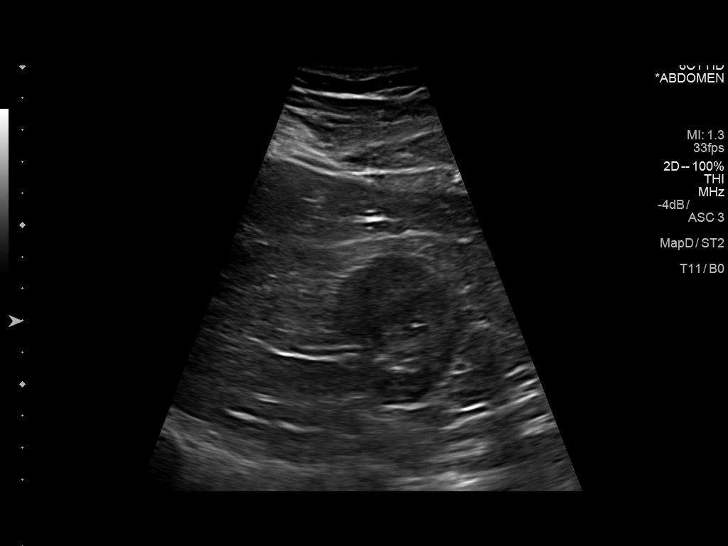
[im 42/63]
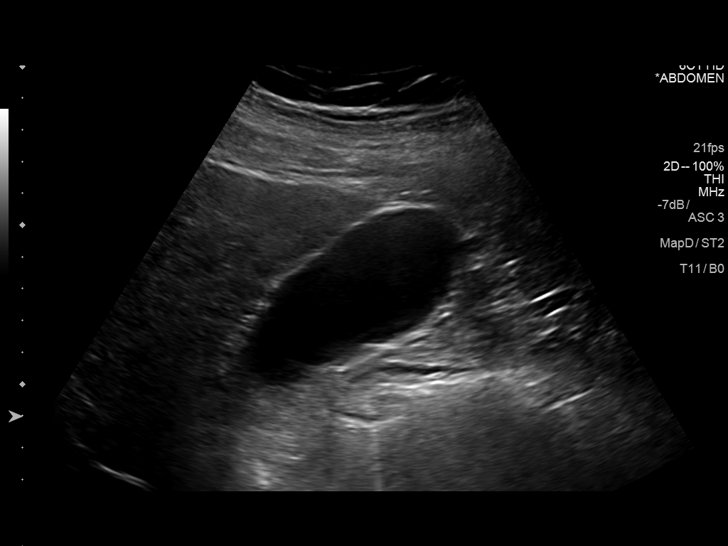
[im 47/63]
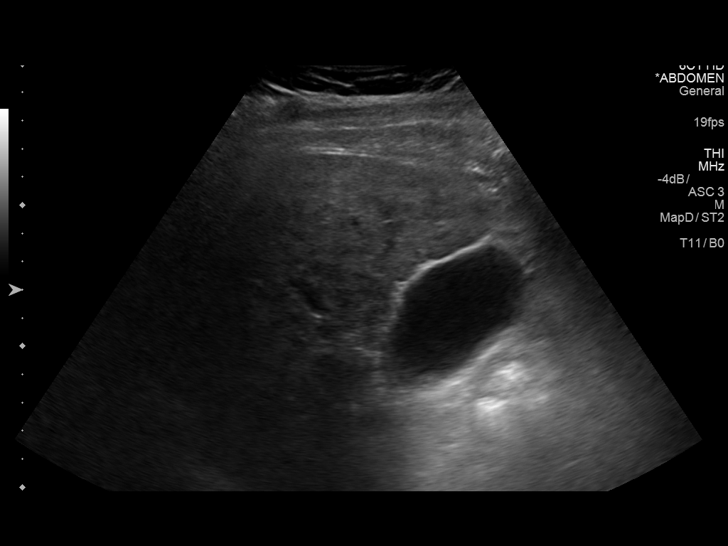
[im 52/63]
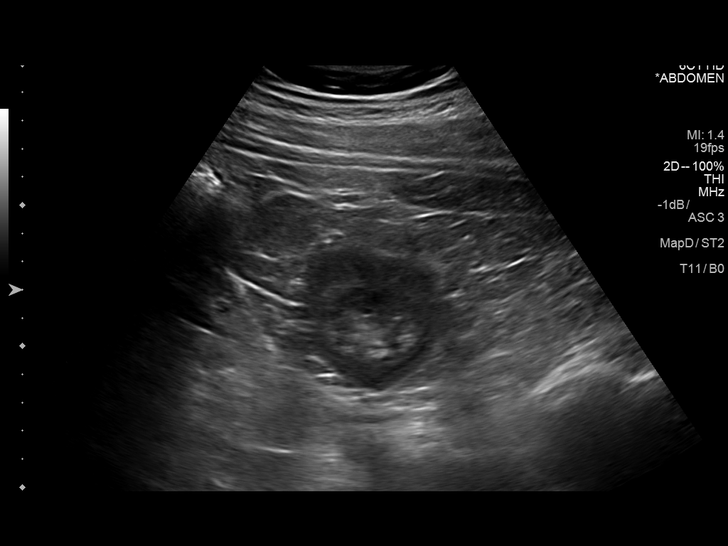
[im 57/63]
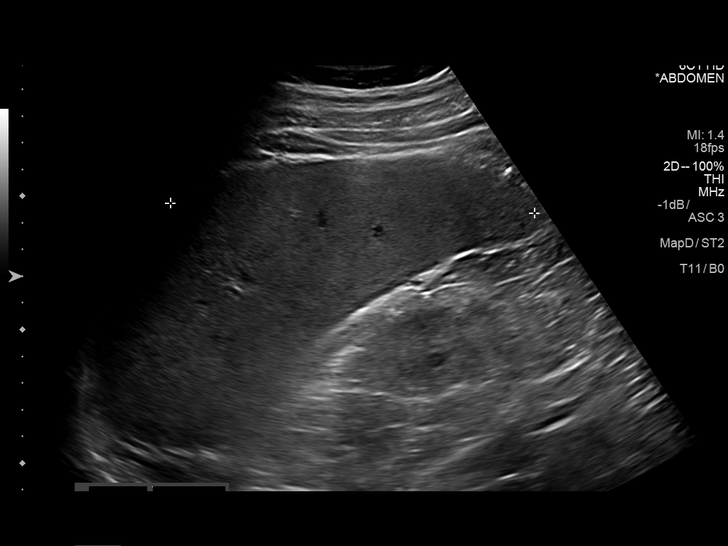
[im 63/63]
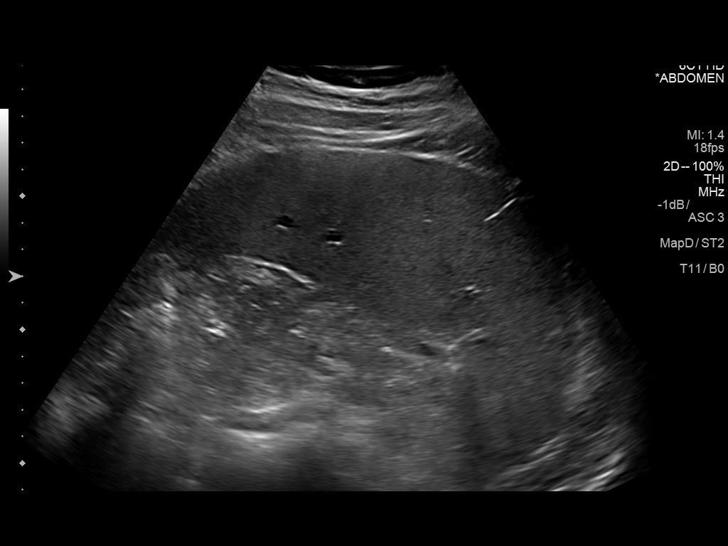

[13 of 25 positions shown; findings below may reference images not displayed]

FINDINGS: Gallbladder: The gallbladder is visualized and no gallstones are
noted. There is no pain over the gallbladder with compression.

Common bile duct: Diameter: The common bile duct is obscured by
bowel gas.

Liver: The liver is inhomogeneous and course consistent with fatty
infiltration. The periphery of the liver does appear to be somewhat
nodular, suggesting cirrhosis. No focal hepatic abnormality seen but
portions of the liver are obscured by bowel gas.

IVC: No abnormality visualized.

Pancreas: Much of the pancreas is obscured by bowel gas

Spleen: 13.6 cm.

Right Kidney: Length: 12.5 cm.  No hydronephrosis is seen.

Left Kidney: Length: 13.5 cm..  No hydronephrosis is noted.

Abdominal aorta: The abdominal aorta is partially obscured by bowel
gas.

Other findings: None.
IMPRESSION: 1. Inhomogeneous coarse liver consistent with fatty infiltration,
with a nodular periphery most consistent with cirrhosis. Portions of
the liver are obscured by bowel gas. If further assessment is
warranted recommend CT of the abdomen with oral and IV contrast.
2. Much of the pancreas is obscured by bowel gas.
3. Prominent spleen.
4. No gallstones.
